# Patient Record
Sex: Male | Born: 1990 | Race: White | Hispanic: No | Marital: Single | Smoking: Never smoker
Health system: Southern US, Community
[De-identification: ages and names within clinical notes are randomized; demographics above are authoritative.]

## PROBLEM LIST (undated history)

## (undated) DIAGNOSIS — J302 Other seasonal allergic rhinitis: Secondary | ICD-10-CM

## (undated) DIAGNOSIS — K219 Gastro-esophageal reflux disease without esophagitis: Secondary | ICD-10-CM

---
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
## 2013-11-05 ENCOUNTER — Ambulatory Visit
Admission: RE | Admit: 2013-11-05 | Discharge: 2013-11-05 | Disposition: A | Discharge status: Home / Self Care | Payer: 59 | Source: Ambulatory Visit | Attending: Sports Medicine | Admitting: Sports Medicine

## 2013-11-05 ENCOUNTER — Encounter: Payer: Self-pay | Admitting: Sports Medicine

## 2013-11-05 ENCOUNTER — Encounter (INDEPENDENT_AMBULATORY_CARE_PROVIDER_SITE_OTHER): Payer: Self-pay

## 2013-11-05 ENCOUNTER — Ambulatory Visit (INDEPENDENT_AMBULATORY_CARE_PROVIDER_SITE_OTHER): Payer: 59 | Admitting: Sports Medicine

## 2013-11-05 VITALS — BP 115/75 | HR 68 | Ht 71.0 in | Wt 150.0 lb

## 2013-11-05 DIAGNOSIS — M25551 Pain in right hip: Secondary | ICD-10-CM

## 2013-11-05 DIAGNOSIS — M24159 Other articular cartilage disorders, unspecified hip: Secondary | ICD-10-CM

## 2013-11-05 DIAGNOSIS — M25559 Pain in unspecified hip: Secondary | ICD-10-CM

## 2013-11-05 DIAGNOSIS — M24151 Other articular cartilage disorders, right hip: Secondary | ICD-10-CM

## 2013-11-05 NOTE — Assessment & Plan Note (Signed)
On today's evaluation I found no significant abnormalities in the hip joint  A standard hip x-ray revealed good articular cartilage and a normal appearance

## 2013-11-05 NOTE — Assessment & Plan Note (Signed)
I think this is a rectus femoris strain based on both the ultrasound and examination  I suggested using a thigh sleeve Also begin a series of exercises to emphasize quadriceps and hip flexion strength  I reassured him that I did not think this involves this joint

## 2013-11-05 NOTE — Progress Notes (Signed)
   Subjective:    Patient ID: Ryan Paul, male    DOB: 1991/01/30, 23 y.o.   MRN: [id number]  HPI Mr. Paul has a history of chondromalacia of the right hip and presents to clinic with right hip pain. He had surgery on his right hip for femoral acetabular impingement .  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 This pain began at the start of summer. He describes an achy pain with occasional shooting pains over his right ASIS/ISIS region. He has been running more on pavement, playing more soccer on artificial turf, and playing a bit more basketball this summer. The pain is also reproducible when he brings his knees to the chest.   Son of Dr Wyszynski/ sister is Amil AmenJulia  Review of Systems     Objective:   Physical Exam Gen: well-dressed, well-nourished young man sitting comfortably  BP 115/75  Pulse 68  Ht 5\' 11"  (1.803 m)  Wt 150 lb (68.04 kg)  BMI 20.93 kg/m2   Right hip: 80-85 degree rotation. Full active and passive ROM bilaterally. Negative FABER and FADIR tests. TTP deep at and inferior to ISIS. Hip strength intact.  Left hip: 90 degree rotation. Full active and passive ROM bilaterally.    Ultrasound right hip: Hyperechoic area identified at rectus femoris insertion to ISIS. This hyperechoic area seems to be in the musculotendinous junction  No swelling around femoral head. There is a small defect that I think represents a postoperative change from the femoral acetabular impingement surgery     Assessment & Plan:  Right hip pain: - physical exam and ultrasound suggests that rectus femoris strain is the likely diagnosis. Joint involvement is unlikely, but will confirm with hip x-ray. - gave pt quad and hip flexor exercises - follow up PRN    Written By: Earlene PlaterBrian Antono, MS4 Reviewed and edited

## 2013-11-05 NOTE — Patient Instructions (Signed)
Work on quad and hip flexor strength: - straight leg raise - step ups - bike

## 2013-11-12 ENCOUNTER — Ambulatory Visit: Payer: 59 | Admitting: Sports Medicine

## 2013-12-15 ENCOUNTER — Encounter (HOSPITAL_COMMUNITY): Payer: Self-pay | Admitting: Pharmacy Technician

## 2013-12-16 ENCOUNTER — Encounter (HOSPITAL_COMMUNITY): Payer: Self-pay | Admitting: *Deleted

## 2013-12-16 MED ORDER — LACTATED RINGERS IV SOLN: INTRAVENOUS | Status: DC

## 2013-12-16 MED ORDER — CHLORHEXIDINE GLUCONATE 4 % EX LIQD
60.0000 mL | Freq: Once | CUTANEOUS | Status: DC
  Filled 2013-12-16: qty 60

## 2013-12-16 MED ORDER — CEFAZOLIN SODIUM-DEXTROSE 2-3 GM-% IV SOLR
2.0000 g | INTRAVENOUS | Status: AC
  Administered 2013-12-17: 2 g via INTRAVENOUS
  Filled 2013-12-16: qty 50

## 2013-12-17 ENCOUNTER — Ambulatory Visit (HOSPITAL_COMMUNITY)
Admission: RE | Admit: 2013-12-17 | Discharge: 2013-12-17 | Disposition: A | Discharge status: Home / Self Care | Payer: 59 | Source: Ambulatory Visit | Attending: Orthopedic Surgery | Admitting: Orthopedic Surgery

## 2013-12-17 ENCOUNTER — Ambulatory Visit (HOSPITAL_COMMUNITY): Payer: 59

## 2013-12-17 ENCOUNTER — Encounter (HOSPITAL_COMMUNITY): Payer: 59 | Admitting: Certified Registered Nurse Anesthetist

## 2013-12-17 ENCOUNTER — Ambulatory Visit (HOSPITAL_COMMUNITY): Payer: 59 | Admitting: Certified Registered Nurse Anesthetist

## 2013-12-17 ENCOUNTER — Encounter (HOSPITAL_COMMUNITY): Payer: Self-pay | Admitting: Certified Registered Nurse Anesthetist

## 2013-12-17 ENCOUNTER — Encounter (HOSPITAL_COMMUNITY)
Admission: RE | Disposition: A | Discharge status: Home / Self Care | Payer: Self-pay | Source: Ambulatory Visit | Attending: Orthopedic Surgery

## 2013-12-17 DIAGNOSIS — Y998 Other external cause status: Secondary | ICD-10-CM | POA: Diagnosis not present

## 2013-12-17 DIAGNOSIS — K219 Gastro-esophageal reflux disease without esophagitis: Secondary | ICD-10-CM | POA: Insufficient documentation

## 2013-12-17 DIAGNOSIS — Y92322 Soccer field as the place of occurrence of the external cause: Secondary | ICD-10-CM | POA: Insufficient documentation

## 2013-12-17 DIAGNOSIS — Y9366 Activity, soccer: Secondary | ICD-10-CM | POA: Diagnosis not present

## 2013-12-17 DIAGNOSIS — J45909 Unspecified asthma, uncomplicated: Secondary | ICD-10-CM | POA: Insufficient documentation

## 2013-12-17 DIAGNOSIS — W19XXXA Unspecified fall, initial encounter: Secondary | ICD-10-CM | POA: Insufficient documentation

## 2013-12-17 DIAGNOSIS — S42022A Displaced fracture of shaft of left clavicle, initial encounter for closed fracture: Secondary | ICD-10-CM | POA: Insufficient documentation

## 2013-12-17 HISTORY — DX: Gastro-esophageal reflux disease without esophagitis: K21.9

## 2013-12-17 HISTORY — DX: Other seasonal allergic rhinitis: J30.2

## 2013-12-17 HISTORY — PX: ORIF CLAVICULAR FRACTURE: SHX5055

## 2013-12-17 LAB — COMPREHENSIVE METABOLIC PANEL
ALBUMIN: 4.2 g/dL (ref 3.5–5.2)
ALT: 16 U/L (ref 0–53)
ANION GAP: 14 (ref 5–15)
AST: 19 U/L (ref 0–37)
Alkaline Phosphatase: 83 U/L (ref 39–117)
BUN: 17 mg/dL (ref 6–23)
CALCIUM: 9.3 mg/dL (ref 8.4–10.5)
CO2: 24 mEq/L (ref 19–32)
CREATININE: 0.97 mg/dL (ref 0.50–1.35)
Chloride: 102 mEq/L (ref 96–112)
GFR calc Af Amer: >90 mL/min (ref >90)
GFR calc non Af Amer: >90 mL/min (ref >90)
Glucose, Bld: 89 mg/dL (ref 70–99)
POTASSIUM: 4.1 meq/L (ref 3.7–5.3)
Sodium: 140 mEq/L (ref 137–147)
TOTAL PROTEIN: 7.7 g/dL (ref 6.0–8.3)
Total Bilirubin: 0.5 mg/dL (ref 0.3–1.2)

## 2013-12-17 LAB — CBC WITH DIFFERENTIAL/PLATELET
BASOS PCT: 1 % (ref 0–1)
Basophils Absolute: 0 10*3/uL (ref 0.0–0.1)
EOS ABS: 0.2 10*3/uL (ref 0.0–0.7)
EOS PCT: 4 % (ref 0–5)
HCT: 47.9 % (ref 39.0–52.0)
HEMOGLOBIN: 16.6 g/dL (ref 13.0–17.0)
Lymphocytes Relative: 44 % (ref 12–46)
Lymphs Abs: 1.7 10*3/uL (ref 0.7–4.0)
MCH: 30 pg (ref 26.0–34.0)
MCHC: 34.7 g/dL (ref 30.0–36.0)
MCV: 86.6 fL (ref 78.0–100.0)
MONOS PCT: 9 % (ref 3–12)
Monocytes Absolute: 0.3 10*3/uL (ref 0.1–1.0)
NEUTROS PCT: 42 % — AB (ref 43–77)
Neutro Abs: 1.6 10*3/uL — ABNORMAL LOW (ref 1.7–7.7)
Platelets: 186 10*3/uL (ref 150–400)
RBC: 5.53 MIL/uL (ref 4.22–5.81)
RDW: 12.4 % (ref 11.5–15.5)
WBC: 3.9 10*3/uL — ABNORMAL LOW (ref 4.0–10.5)

## 2013-12-17 LAB — APTT: APTT: 29 s (ref 24–37)

## 2013-12-17 LAB — PROTIME-INR
INR: 1.04 (ref 0.00–1.49)
PROTHROMBIN TIME: 13.6 s (ref 11.6–15.2)

## 2013-12-17 SURGERY — OPEN REDUCTION INTERNAL FIXATION (ORIF) CLAVICULAR FRACTURE
Anesthesia: General | Laterality: Left

## 2013-12-17 MED ORDER — STERILE WATER FOR INJECTION IJ SOLN
INTRAMUSCULAR | Status: AC
  Filled 2013-12-17: qty 10

## 2013-12-17 MED ORDER — DEXAMETHASONE SODIUM PHOSPHATE 4 MG/ML IJ SOLN
INTRAMUSCULAR | Status: DC | PRN
  Administered 2013-12-17: 8 mg via INTRAVENOUS

## 2013-12-17 MED ORDER — DEXAMETHASONE SODIUM PHOSPHATE 4 MG/ML IJ SOLN
INTRAMUSCULAR | Status: AC
  Filled 2013-12-17: qty 2

## 2013-12-17 MED ORDER — OXYCODONE HCL 5 MG/5ML PO SOLN: 5.0000 mg | Freq: Once | ORAL | Status: AC | PRN

## 2013-12-17 MED ORDER — BUPIVACAINE LIPOSOME 1.3 % IJ SUSP
20.0000 mL | INTRAMUSCULAR | Status: DC
  Filled 2013-12-17: qty 20

## 2013-12-17 MED ORDER — PROPOFOL 10 MG/ML IV BOLUS
INTRAVENOUS | Status: AC
  Filled 2013-12-17: qty 20

## 2013-12-17 MED ORDER — BUPIVACAINE LIPOSOME 1.3 % IJ SUSP
INTRAMUSCULAR | Status: DC | PRN
  Administered 2013-12-17: 20 mL

## 2013-12-17 MED ORDER — LIDOCAINE HCL (CARDIAC) 20 MG/ML IV SOLN
INTRAVENOUS | Status: AC
  Filled 2013-12-17: qty 5

## 2013-12-17 MED ORDER — ONDANSETRON HCL 4 MG/2ML IJ SOLN
INTRAMUSCULAR | Status: AC
  Filled 2013-12-17: qty 2

## 2013-12-17 MED ORDER — OXYCODONE-ACETAMINOPHEN 5-325 MG PO TABS
1.0000 | ORAL_TABLET | ORAL | Status: AC | PRN
Start: 2013-12-17 — End: ?

## 2013-12-17 MED ORDER — DIAZEPAM 5 MG PO TABS: 2.5000 mg | ORAL_TABLET | Freq: Four times a day (QID) | ORAL | Status: AC | PRN

## 2013-12-17 MED ORDER — OXYCODONE HCL 5 MG PO TABS
ORAL_TABLET | ORAL | Status: AC
  Filled 2013-12-17: qty 1

## 2013-12-17 MED ORDER — HYDROMORPHONE HCL 1 MG/ML IJ SOLN
INTRAMUSCULAR | Status: AC
  Filled 2013-12-17: qty 1

## 2013-12-17 MED ORDER — SODIUM CHLORIDE 0.9 % IJ SOLN
INTRAMUSCULAR | Status: DC | PRN
  Administered 2013-12-17: 10 mL

## 2013-12-17 MED ORDER — PROPOFOL 10 MG/ML IV BOLUS
INTRAVENOUS | Status: AC
Start: 2013-12-17 — End: 2013-12-17
  Filled 2013-12-17: qty 20

## 2013-12-17 MED ORDER — LACTATED RINGERS IV SOLN
INTRAVENOUS | Status: DC
  Administered 2013-12-17: 11:00:00 via INTRAVENOUS

## 2013-12-17 MED ORDER — GLYCOPYRROLATE 0.2 MG/ML IJ SOLN
INTRAMUSCULAR | Status: DC | PRN
  Administered 2013-12-17: 0.6 mg via INTRAVENOUS

## 2013-12-17 MED ORDER — LACTATED RINGERS IV SOLN
INTRAVENOUS | Status: DC | PRN
  Administered 2013-12-17 (×2): via INTRAVENOUS

## 2013-12-17 MED ORDER — ONDANSETRON HCL 4 MG/2ML IJ SOLN
4.0000 mg | Freq: Once | INTRAMUSCULAR | Status: AC | PRN
  Administered 2013-12-17: 4 mg via INTRAVENOUS

## 2013-12-17 MED ORDER — LIDOCAINE HCL (CARDIAC) 20 MG/ML IV SOLN
INTRAVENOUS | Status: DC | PRN
  Administered 2013-12-17: 50 mg via INTRAVENOUS

## 2013-12-17 MED ORDER — FENTANYL CITRATE 0.05 MG/ML IJ SOLN
INTRAMUSCULAR | Status: DC | PRN
  Administered 2013-12-17: 50 ug via INTRAVENOUS
  Administered 2013-12-17 (×2): 100 ug via INTRAVENOUS

## 2013-12-17 MED ORDER — ONDANSETRON HCL 8 MG PO TABS: 8.0000 mg | ORAL_TABLET | Freq: Once | ORAL | Status: DC

## 2013-12-17 MED ORDER — PROPOFOL 10 MG/ML IV BOLUS
INTRAVENOUS | Status: DC | PRN
  Administered 2013-12-17: 150 mg via INTRAVENOUS
  Administered 2013-12-17: 50 mg via INTRAVENOUS
  Administered 2013-12-17: 10 mg via INTRAVENOUS

## 2013-12-17 MED ORDER — DEXTROSE 5 % IV SOLN
10.0000 mg | INTRAVENOUS | Status: DC | PRN
  Administered 2013-12-17: 25 ug/min via INTRAVENOUS

## 2013-12-17 MED ORDER — VECURONIUM BROMIDE 10 MG IV SOLR
INTRAVENOUS | Status: DC | PRN
  Administered 2013-12-17: 1 mg via INTRAVENOUS
  Administered 2013-12-17: 2 mg via INTRAVENOUS

## 2013-12-17 MED ORDER — FENTANYL CITRATE 0.05 MG/ML IJ SOLN
INTRAMUSCULAR | Status: AC
  Filled 2013-12-17: qty 5

## 2013-12-17 MED ORDER — ROCURONIUM BROMIDE 50 MG/5ML IV SOLN
INTRAVENOUS | Status: AC
  Filled 2013-12-17: qty 1

## 2013-12-17 MED ORDER — VECURONIUM BROMIDE 10 MG IV SOLR
INTRAVENOUS | Status: AC
  Filled 2013-12-17: qty 10

## 2013-12-17 MED ORDER — 0.9 % SODIUM CHLORIDE (POUR BTL) OPTIME
TOPICAL | Status: DC | PRN
  Administered 2013-12-17: 1000 mL

## 2013-12-17 MED ORDER — ROCURONIUM BROMIDE 100 MG/10ML IV SOLN
INTRAVENOUS | Status: DC | PRN
  Administered 2013-12-17: 50 mg via INTRAVENOUS

## 2013-12-17 MED ORDER — MEPERIDINE HCL 25 MG/ML IJ SOLN: 6.2500 mg | INTRAMUSCULAR | Status: DC | PRN

## 2013-12-17 MED ORDER — OXYCODONE HCL 5 MG PO TABS
5.0000 mg | ORAL_TABLET | Freq: Once | ORAL | Status: AC | PRN
  Administered 2013-12-17: 5 mg via ORAL

## 2013-12-17 MED ORDER — NEOSTIGMINE METHYLSULFATE 10 MG/10ML IV SOLN
INTRAVENOUS | Status: DC | PRN
  Administered 2013-12-17: 4 mg via INTRAVENOUS

## 2013-12-17 MED ORDER — ONDANSETRON HCL 4 MG/2ML IJ SOLN
INTRAMUSCULAR | Status: DC | PRN
  Administered 2013-12-17: 4 mg via INTRAVENOUS

## 2013-12-17 MED ORDER — ONDANSETRON 8 MG PO TBDP
8.0000 mg | ORAL_TABLET | Freq: Once | ORAL | Status: AC
Start: 2013-12-17 — End: 2013-12-17
  Administered 2013-12-17: 8 mg via ORAL
  Filled 2013-12-17: qty 1

## 2013-12-17 MED ORDER — EPHEDRINE SULFATE 50 MG/ML IJ SOLN
INTRAMUSCULAR | Status: DC | PRN
  Administered 2013-12-17: 10 mg via INTRAVENOUS

## 2013-12-17 MED ORDER — HYDROMORPHONE HCL 1 MG/ML IJ SOLN
0.2500 mg | INTRAMUSCULAR | Status: DC | PRN
  Administered 2013-12-17 (×4): 0.5 mg via INTRAVENOUS

## 2013-12-17 MED ORDER — MIDAZOLAM HCL 5 MG/5ML IJ SOLN
INTRAMUSCULAR | Status: DC | PRN
  Administered 2013-12-17: 2 mg via INTRAVENOUS

## 2013-12-17 MED ORDER — GLYCOPYRROLATE 0.2 MG/ML IJ SOLN
INTRAMUSCULAR | Status: AC
  Filled 2013-12-17: qty 2

## 2013-12-17 SURGICAL SUPPLY — 63 items
ADH SKN CLS APL DERMABOND .7 (GAUZE/BANDAGES/DRESSINGS) ×1
BIT DRILL 2.0 LNG QUCK RELEASE (BIT) IMPLANT
BIT DRILL 2.3 QUICK RELEASE (BIT) IMPLANT
BIT DRILL 2.8X5 QR DISP (BIT) ×2 IMPLANT
CLOSURE WOUND 1/2 X4 (GAUZE/BANDAGES/DRESSINGS) ×1
COVER SURGICAL LIGHT HANDLE (MISCELLANEOUS) ×3 IMPLANT
DERMABOND ADVANCED (GAUZE/BANDAGES/DRESSINGS) ×2
DERMABOND ADVANCED .7 DNX12 (GAUZE/BANDAGES/DRESSINGS) ×1 IMPLANT
DRAPE C-ARM 42X72 X-RAY (DRAPES) ×3 IMPLANT
DRAPE INCISE IOBAN 66X45 STRL (DRAPES) ×3 IMPLANT
DRAPE ORTHO SPLIT 77X108 STRL (DRAPES) ×6
DRAPE SURG 17X23 STRL (DRAPES) ×3 IMPLANT
DRAPE SURG ORHT 6 SPLT 77X108 (DRAPES) ×1 IMPLANT
DRAPE U-SHAPE 47X51 STRL (DRAPES) ×6 IMPLANT
DRILL 2.0 LNG QUICK RELEASE (BIT) ×3
DRILL 2.3 QUICK RELEASE (BIT) ×3
DRSG EMULSION OIL 3X3 NADH (GAUZE/BANDAGES/DRESSINGS) ×3 IMPLANT
DRSG MEPILEX BORDER 4X8 (GAUZE/BANDAGES/DRESSINGS) ×3 IMPLANT
ELECT REM PT RETURN 9FT ADLT (ELECTROSURGICAL) ×3
ELECTRODE REM PT RTRN 9FT ADLT (ELECTROSURGICAL) ×1 IMPLANT
GAUZE SPONGE 4X4 12PLY STRL (GAUZE/BANDAGES/DRESSINGS) ×3 IMPLANT
GLOVE BIO SURGEON STRL SZ7.5 (GLOVE) ×3 IMPLANT
GLOVE BIO SURGEON STRL SZ8 (GLOVE) ×3 IMPLANT
GLOVE EUDERMIC 7 POWDERFREE (GLOVE) ×5 IMPLANT
GLOVE SS BIOGEL STRL SZ 7.5 (GLOVE) ×1 IMPLANT
GLOVE SUPERSENSE BIOGEL SZ 7.5 (GLOVE) ×2
GOWN STRL REUS W/ TWL LRG LVL3 (GOWN DISPOSABLE) ×1 IMPLANT
GOWN STRL REUS W/ TWL XL LVL3 (GOWN DISPOSABLE) ×2 IMPLANT
GOWN STRL REUS W/TWL LRG LVL3 (GOWN DISPOSABLE) ×3
GOWN STRL REUS W/TWL XL LVL3 (GOWN DISPOSABLE) ×6
KIT BASIN OR (CUSTOM PROCEDURE TRAY) ×3 IMPLANT
KIT ROOM TURNOVER OR (KITS) ×3 IMPLANT
MANIFOLD NEPTUNE II (INSTRUMENTS) ×3 IMPLANT
NDL HYPO 25GX1X1/2 BEV (NEEDLE) ×1 IMPLANT
NEEDLE HYPO 25GX1X1/2 BEV (NEEDLE) ×3 IMPLANT
NS IRRIG 1000ML POUR BTL (IV SOLUTION) ×3 IMPLANT
PACK SHOULDER (CUSTOM PROCEDURE TRAY) ×3 IMPLANT
PAD ARMBOARD 7.5X6 YLW CONV (MISCELLANEOUS) ×6 IMPLANT
PLATE LEFT CLAVICLE 8H (Plate) ×2 IMPLANT
SCREW HEXALOBE LOCKING 3.5X14M (Screw) ×2 IMPLANT
SCREW HEXALOBE NON-LOCK 3.5X14 (Screw) ×2 IMPLANT
SCREW LOCK 12X3.5X HEXALOBE (Screw) IMPLANT
SCREW LOCKING 3.5X12 (Screw) ×3 IMPLANT
SCREW LOCKING 3.5X8 (Screw) ×2 IMPLANT
SCREW NON TOGG 2.3X20MM (Screw) ×2 IMPLANT
SCREW NONLOCK HEX 3.5X12 (Screw) ×4 IMPLANT
SLING ARM FOAM STRAP LRG (SOFTGOODS) ×3 IMPLANT
SPONGE LAP 18X18 X RAY DECT (DISPOSABLE) ×6 IMPLANT
SPONGE LAP 4X18 X RAY DECT (DISPOSABLE) ×6 IMPLANT
STAPLER VISISTAT 35W (STAPLE) ×3 IMPLANT
STRIP CLOSURE SKIN 1/2X4 (GAUZE/BANDAGES/DRESSINGS) ×2 IMPLANT
SUCTION FRAZIER TIP 10 FR DISP (SUCTIONS) ×3 IMPLANT
SUT MNCRL AB 3-0 PS2 18 (SUTURE) ×3 IMPLANT
SUT VIC AB 1 CT1 27 (SUTURE) ×3
SUT VIC AB 1 CT1 27XBRD ANBCTR (SUTURE) ×1 IMPLANT
SUT VIC AB 2-0 CT1 27 (SUTURE) ×3
SUT VIC AB 2-0 CT1 TAPERPNT 27 (SUTURE) ×1 IMPLANT
SUT VICRYL 0 CT 1 36IN (SUTURE) ×3 IMPLANT
SYR CONTROL 10ML LL (SYRINGE) ×3 IMPLANT
TOWEL OR 17X24 6PK STRL BLUE (TOWEL DISPOSABLE) ×3 IMPLANT
TOWEL OR 17X26 10 PK STRL BLUE (TOWEL DISPOSABLE) ×3 IMPLANT
WATER STERILE IRR 1000ML POUR (IV SOLUTION) ×3 IMPLANT
YANKAUER SUCT BULB TIP NO VENT (SUCTIONS) ×3 IMPLANT

## 2013-12-17 NOTE — Discharge Instructions (Signed)
You may remove current dressing and shower on day 3. There is dermabond over your incision itself which will remain in place and is waterproof. You may replace a clean dressing after shower.  Use ice to operative site for pain control, atleast 15-20 minutes 3-4 times a day and more as needed.  You may allow arm to dangle by side and to move your elbow wrist and hand. Continue sling use otherwise.  What to eat:  For your first meals, you should eat lightly; only small meals initially.  If you do not have nausea, you may eat larger meals.  Avoid spicy, greasy and heavy food.    General Anesthesia, Adult, Care After  Refer to this sheet in the next few weeks. These instructions provide you with information on caring for yourself after your procedure. Your health care provider may also give you more specific instructions. Your treatment has been planned according to current medical practices, but problems sometimes occur. Call your health care provider if you have any problems or questions after your procedure.  WHAT TO EXPECT AFTER THE PROCEDURE  After the procedure, it is typical to experience:  Sleepiness.  Nausea and vomiting. HOME CARE INSTRUCTIONS  For the first 24 hours after general anesthesia:  Have a responsible person with you.  Do not drive a car. If you are alone, do not take public transportation.  Do not drink alcohol.  Do not take medicine that has not been prescribed by your health care provider.  Do not sign important papers or make important decisions.  You may resume a normal diet and activities as directed by your health care provider.  Change bandages (dressings) as directed.  If you have questions or problems that seem related to general anesthesia, call the hospital and ask for the anesthetist or anesthesiologist on call. SEEK MEDICAL CARE IF:  You have nausea and vomiting that continue the day after anesthesia.  You develop a rash. SEEK IMMEDIATE MEDICAL CARE IF:   You have difficulty breathing.  You have chest pain.  You have any allergic problems. Document Released: 06/11/2000 Document Revised: 11/05/2012 Document Reviewed: 09/18/2012  [facility] Patient Information 2014 [place] [place].

## 2013-12-17 NOTE — Anesthesia Postprocedure Evaluation (Signed)
  Anesthesia Post-op Note  Patient: Ryan Paul  Procedure(s) Performed: Procedure(s): OPEN REDUCTION INTERNAL FIXATION (ORIF)  LEFT CLAVICULAR FRACTURE (Left)  Patient Location: PACU  Anesthesia Type: General   Level of Consciousness: awake, alert  and oriented  Airway and Oxygen Therapy: Patient Spontanous Breathing  Post-op Pain: mild  Post-op Assessment: Post-op Vital signs reviewed  Post-op Vital Signs: Reviewed  Last Vitals:  Filed Vitals:   12/17/13 1600  BP: 142/82  Pulse: 70  Temp:   Resp: 15    Complications: No apparent anesthesia complications

## 2013-12-17 NOTE — Anesthesia Preprocedure Evaluation (Signed)
Anesthesia Evaluation  Patient identified by MRN, date of birth, ID band Patient awake    Reviewed: Allergy & Precautions, H&P , NPO status , Patient's Chart, lab work & pertinent test results  Airway Mallampati: I TM Distance: >3 FB Neck ROM: Full    Dental   Pulmonary asthma ,          Cardiovascular     Neuro/Psych    GI/Hepatic   Endo/Other    Renal/GU      Musculoskeletal   Abdominal   Peds  Hematology   Anesthesia Other Findings   Reproductive/Obstetrics                           Anesthesia Physical Anesthesia Plan  ASA: I  Anesthesia Plan: General   Post-op Pain Management:    Induction: Intravenous  Airway Management Planned: Oral ETT  Additional Equipment:   Intra-op Plan:   Post-operative Plan: Extubation in OR  Informed Consent: I have reviewed the patients History and Physical, chart, labs and discussed the procedure including the risks, benefits and alternatives for the proposed anesthesia with the patient or authorized representative who has indicated his/her understanding and acceptance.     Plan Discussed with: CRNA and Surgeon  Anesthesia Plan Comments:         Anesthesia Quick Evaluation  

## 2013-12-17 NOTE — Transfer of Care (Signed)
Immediate Anesthesia Transfer of Care Note  Patient: Ryan Paul  Procedure(s) Performed: Procedure(s): OPEN REDUCTION INTERNAL FIXATION (ORIF)  LEFT CLAVICULAR FRACTURE (Left)  Patient Location: PACU  Anesthesia Type:General  Level of Consciousness: patient cooperative and responds to stimulation  Airway & Oxygen Therapy: Patient Spontanous Breathing and Patient connected to nasal cannula oxygen  Post-op Assessment: Report given to PACU RN and Post -op Vital signs reviewed and stable  Post vital signs: Reviewed and stable  Complications: No apparent anesthesia complications

## 2013-12-17 NOTE — H&P (Signed)
Ryan Paul    Chief Complaint: LEFT CLAVICLE FRACTURE HPI: The patient is a 23 y.o. male with a severely displaced left clavicle fracture  Past Medical History  Diagnosis Date  . Asthma   . GERD (gastroesophageal reflux disease)   . Seasonal allergies     Past Surgical History  Procedure Laterality Date  . Hip arthroscopy w/ labral repair  2010    Family History  Problem Relation Age of Onset  . Heart disease Maternal Grandfather   . Hypertension Maternal Grandfather   . Hyperlipidemia Paternal Grandfather   . Heart disease Paternal Grandfather   . Hypertension Paternal Grandfather     Social History:  reports that he has never smoked. He has never used smokeless tobacco. His alcohol and drug histories are not on file.  Allergies: No Known Allergies  Medications Prior to Admission  Medication Dose Route Frequency Provider Last Rate Last Dose  . meningococcal polysaccharide (MENACTRA) injection 0.5 mL  0.5 mL Intramuscular Once Kristian CoveyBruce W Burchette, MD       Medications Prior to Admission  Medication Sig Dispense Refill  . naproxen sodium (ALEVE) 220 MG tablet Take 220-440 mg by mouth 2 (two) times daily as needed (for pan).      Marland Kitchen. oxyCODONE-acetaminophen (PERCOCET) 10-325 MG per tablet Take 1 tablet by mouth every 4 (four) hours as needed for pain (taking at bedtime only).         Physical Exam: left shoulder with prominent swelling over mid clavicle with underlying bony deformity. N/V intact distally  Vitals  Temp:  [98.6 F (37 C)] 98.6 F (37 C) (10/01 1034) Pulse Rate:  [58-70] 70 (10/01 1128) Resp:  [13-22] 22 (10/01 1128) BP: (119-132)/(71-72) 119/72 mmHg (10/01 1125) SpO2:  [98 %-100 %] 99 % (10/01 1128) Weight:  [69.996 kg (154 lb 5 oz)] 69.996 kg (154 lb 5 oz) (10/01 1034)  Assessment/Plan  Impression: LEFT CLAVICLE FRACTURE  Plan of Action: Procedure(s): OPEN REDUCTION INTERNAL FIXATION (ORIF)  LEFT CLAVICULAR FRACTURE  Ryan Conaty  Paul 12/17/2013, 11:36 AM

## 2013-12-17 NOTE — Op Note (Signed)
12/17/2013  2:19 PM  PATIENT:   Ryan Paul  23 y.o. male  PRE-OPERATIVE DIAGNOSIS:  DISPLACED LEFT CLAVICLE FRACTURE  POST-OPERATIVE DIAGNOSIS:  Same  PROCEDURE:  ORIF  SURGEON:  Bruk Tumolo, Vania ReaKevin M. M.D.  ASSISTANTS: Shuford pac   ANESTHESIA:   GET + local  EBL: 50   SPECIMEN:  none  Drains: none   PATIENT DISPOSITION:  PACU - hemodynamically stable.    PLAN OF CARE: Discharge to home after PACU  Dictation# [id number]

## 2013-12-17 NOTE — Op Note (Signed)
NAME:  Ryan Paul, Ryan Paul              ACCOUNT NO.:  [id number]  MEDICAL RECORD NO.:  [id number]  LOCATION:  [facility]                         FACILITY:  [facility]  PHYSICIAN:  Vania ReaKevin M. Shauntelle Jamerson, M.D.  DATE OF BIRTH:  1990-06-17  DATE OF PROCEDURE:  12/17/2013 DATE OF DISCHARGE:                              OPERATIVE REPORT   PREOPERATIVE DIAGNOSIS:  Displaced comminuted left midshaft clavicle fracture.  POSTOPERATIVE DIAGNOSIS:  Displaced comminuted left midshaft clavicle fracture.  PROCEDURE:  Open reduction and internal fixation of the displaced left clavicle fracture.  SURGEON:  Vania ReaKevin M. Annaston Upham, M.D.  Threasa HeadsASSISTANFrench Ana:  Tracy A. Shuford, PA-C.  ANESTHESIA:  General endotracheal as well as local.  ESTIMATED BLOOD LOSS:  Less than 50 mL.  DRAINS:  None.  HISTORY:  Ryan Paul is a 23 year old male, who injured his left shoulder playing soccer approximately 2 weeks ago landing on the apex left shoulder sustaining a comminuted and displaced midshaft clavicle fracture.  Radiographs confirmed significant shortening with a butterfly fragment that is spun close to 90 degrees and tenting the skin.  Due to significant displacement, comminution, and impending skin compromise, he is brought to the operating room at this time for planned open reduction and internal fixation.  Preoperatively, I counseled Ryan Paul and his family regarding treatment options and risks versus benefits thereof.  Possible surgical complications were all reviewed including the potential for bleeding, infection, neurovascular injury, malunion, nonunion, loss of fixation, and possible need for additional surgery.  He understands and accepts and agrees our planned procedure.  PROCEDURE IN DETAIL:  After undergoing routine preop evaluation, the patient received prophylactic antibiotics.  Brought to the operating room, placed supine on the operating table, underwent smooth induction of a general endotracheal anesthesia.  Placed into  beach-chair position and appropriately padded and protected.  The left shoulder girdle region was then sterilely prepped and draped in standard fashion.  Time-out was called.  A transverse incision along the inferior border of the clavicle was made approximately 6 cm in length centered at the fracture site. Dissection carried deeply and the skin flaps were elevated superiorly and inferiorly.  We then divided the clavicle pectoral fascia and exposed the clavicle both medial and lateral at the fracture site. There was some moderate comminution with a single dominant butterfly fragment that had spun 90 degrees.  Some callus had been starting to develop in the area and we dissected the soft tissue envelope to isolate the bony fragments.  We are able to mobilize the fracture in with manipulation table to gain realignment at the fracture site and reposition the butterfly fragment and hold provisional fixation with bone clamps.  At this point, intraoperative fluoroscopic images were then used to confirm good overall alignment at the fracture site.  At this point, we selected the appropriate plate from the Acumed clavicle plating system.  I did some additional contouring for appropriate contact to the bone.  At this point, I re-went ahead and transfixed the plate initially with 2 nonlocking screws.  Fasting plate to the clavicle with excellent fit and compression and confirmed anatomic alignment at the fracture site.  At this point, then I used a lag screw technique to lag the butterfly  fragment into the lateral segment of the clavicle obtaining good alignment.  We then had completed the fixation of the plate to the clavicle with combination of the locking screws with excellent fit and fixation.  Final images were then obtained which showed good alignment of the fracture site and good position of the hardware.  At this point, the wound was copiously irrigated.  Hemostasis was obtained.  The  periosteal and deep fascial layers were closed with a series of figure-of-eight #1 Vicryl sutures creating a robust soft tissue envelope about the repair of construct and clavicle.  A 2-0 Vicryl used for the subcu layer and intracuticular 3-0 Monocryl of the skin followed by Dermabond and a Mepilex dressing abundant Exparel sustained release.  Local anesthetic was instilled into the peri- incisional soft tissues  prior to placement of the dressings.  Left arm was then placed in a sling.  The patient was awakened, extubated, and taken to recovery room in stable condition.     Vania Rea. Vasiliki Smaldone, M.D.     KMS/MEDQ  D:  12/17/2013  T:  12/17/2013  Job:  [id number]

## 2013-12-21 ENCOUNTER — Encounter (HOSPITAL_COMMUNITY): Payer: Self-pay | Admitting: Orthopedic Surgery

## 2014-07-29 ENCOUNTER — Ambulatory Visit (INDEPENDENT_AMBULATORY_CARE_PROVIDER_SITE_OTHER): Payer: 59 | Admitting: Sports Medicine

## 2014-07-29 ENCOUNTER — Encounter: Payer: Self-pay | Admitting: Sports Medicine

## 2014-07-29 VITALS — BP 111/76 | HR 74 | Ht 71.0 in | Wt 150.0 lb

## 2014-07-29 DIAGNOSIS — M25551 Pain in right hip: Secondary | ICD-10-CM | POA: Diagnosis not present

## 2014-07-29 NOTE — Assessment & Plan Note (Signed)
Hx of labral tear but this seems stable and ASX now  Still mild pain at sartorius insertion but only with sitting and too much resistance  Reassured and would keep up normal activities  Concerned about increased long term risk - advised less likely with normal weight and exc strength

## 2014-07-29 NOTE — Progress Notes (Signed)
Patient ID: Ryan Paul, male   DOB: 01/02/1991, 24 y.o.   MRN: [id number]  Patient has a history of labral repair of RT hip.  Long term soccer athlete. [facility] [employer]by Renella CunasAlston Stubbs, MD.  Some remodeling of fermoral neck.  RT hip has been good x when I saw him for a partial sartorius tendon tear at [facility]IS last year.  Now active and plays sports with no real hip pain.  Gets mild pain above hip near ASIS with sitting too much.  Also hx of sdisplaced clavicle Fx treated with plate and IF by Dr supple last year.  This has done well.  Comes for advice and reevaluation.  Exam Muscular/ NAD BP 111/76 mmHg  Pulse 74  Ht 5\' 11"  (1.803 m)  Wt 150 lb (68.04 kg)  BMI 20.93 kg/m2  RT hip ; full ROM/ he gets 120 deg rotation at 90/90 Hip acduction/ adduction/ flexion strength all excellent Neg FADIR with mild pain on grind at end point Neg FABER with nl ROM Sartorius strength exelletn but feels this at attachment ASIS  Clavical healed well NT to percussion exc strength

## 2015-04-20 DIAGNOSIS — S42022D Displaced fracture of shaft of left clavicle, subsequent encounter for fracture with routine healing: Secondary | ICD-10-CM | POA: Diagnosis not present

## 2015-05-09 MED FILL — SERTRALINE HCL 100 MG TAB: 100 | 60 days supply | Qty: 60 | Fill #2

## 2015-05-19 ENCOUNTER — Other Ambulatory Visit: Payer: Self-pay

## 2015-05-19 MED ORDER — ALBUTEROL SULFATE HFA 108 (90 BASE) MCG/ACT IN AERS: 2.0000 | INHALATION_SPRAY | Freq: Four times a day (QID) | RESPIRATORY_TRACT | Status: AC | PRN

## 2015-05-19 MED FILL — PROAIR HFA 90 MCG INHALER: 108 (90 BAS | 25 days supply | Qty: 9 | Fill #0

## 2015-06-07 DIAGNOSIS — F411 Generalized anxiety disorder: Secondary | ICD-10-CM | POA: Diagnosis not present

## 2015-06-24 DIAGNOSIS — F419 Anxiety disorder, unspecified: Secondary | ICD-10-CM | POA: Diagnosis not present

## 2015-06-24 MED FILL — SERTRALINE HCL 100 MG TAB: 100 | 30 days supply | Qty: 60 | Fill #0 | Status: TO

## 2015-08-02 DIAGNOSIS — F419 Anxiety disorder, unspecified: Secondary | ICD-10-CM | POA: Diagnosis not present

## 2015-08-02 MED FILL — SERTRALINE HCL 100 MG TAB: 100 | 30 days supply | Qty: 60 | Fill #0

## 2015-09-01 MED FILL — SERTRALINE HCL 100 MG TAB: 100 | 30 days supply | Qty: 60 | Fill #1 | Status: TO

## 2015-10-04 MED FILL — SERTRALINE HCL 100 MG TAB: 100 | 30 days supply | Qty: 60 | Fill #0

## 2015-10-16 IMAGING — RF DG CLAVICLE*L*
1 series · 2 of 2 positions shown · IV contrast (agent unspecified)
Comparison: None

CLINICAL DATA: ORIF left clavicle

EXAM:
DG C-ARM 61-120 MIN; LEFT CLAVICLE - 2+ VIEWS
TECHNIQUE: Two views from portable C-arm radiography obtained in the operating
room were submitted
CONTRAST:  No contrast
FLUOROSCOPY TIME:  9 seconds

[Series 1: run · 2 of 2 slices shown]
[im 1/2]
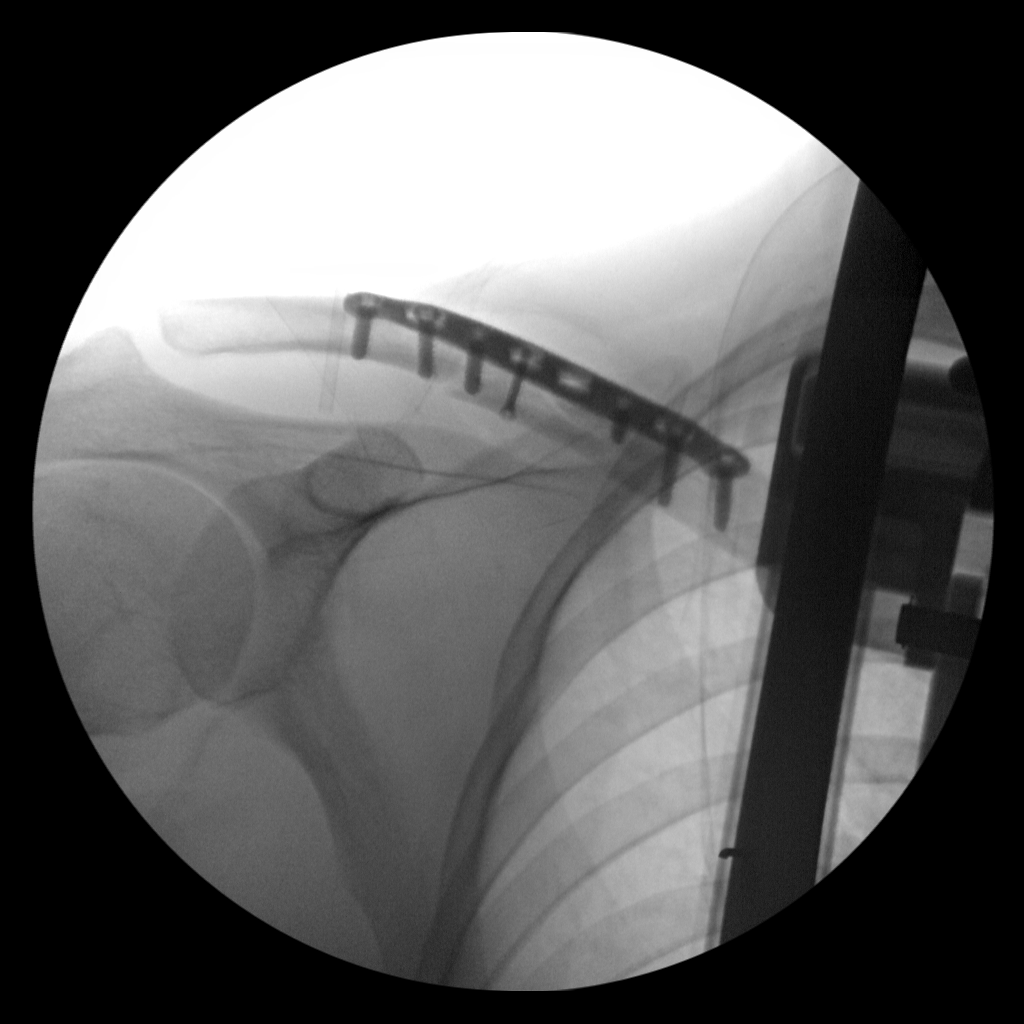
[im 2/2]
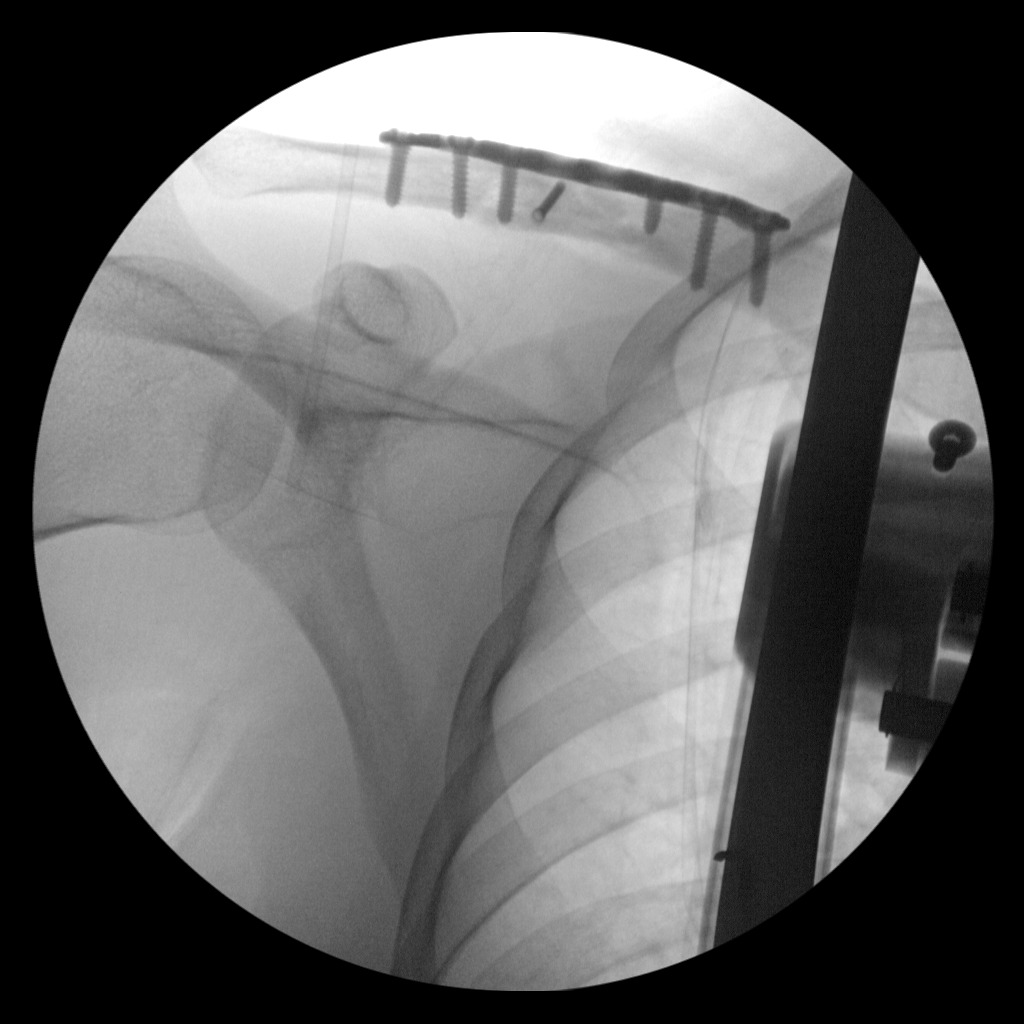

[2 of 2 positions shown; findings below may reference images not displayed]

FINDINGS: Mid left clavicle fracture is fixated by a sideplate and screw
device. Hardware components and fracture fragments are in anatomic
alignment.
IMPRESSION: 1. Status post ORIF of left cryo vocal fracture.

## 2015-11-03 MED FILL — SERTRALINE HCL 100 MG TAB: 100 | 30 days supply | Qty: 60 | Fill #0

## 2015-11-15 DIAGNOSIS — F419 Anxiety disorder, unspecified: Secondary | ICD-10-CM | POA: Diagnosis not present

## 2015-11-23 MED FILL — CIALIS 10 MG TABLET: 10 | 30 days supply | Qty: 6 | Fill #0

## 2015-12-07 MED FILL — SERTRALINE HCL 100 MG TAB: 100 | 30 days supply | Qty: 60 | Fill #0

## 2015-12-28 MED FILL — CIALIS 10 MG TABLET: 10 | 30 days supply | Qty: 6 | Fill #0

## 2016-01-09 MED FILL — SERTRALINE HCL 100 MG TAB: 100 | 30 days supply | Qty: 60 | Fill #1

## 2016-02-10 MED FILL — SERTRALINE HCL 100 MG TAB: 100 | 30 days supply | Qty: 60 | Fill #2

## 2016-02-27 MED FILL — CIALIS 10 MG TABLET: 10 | 30 days supply | Qty: 6 | Fill #0

## 2016-03-13 MED FILL — SERTRALINE HCL 100 MG TAB: 100 | 30 days supply | Qty: 60 | Fill #0

## 2016-04-12 MED FILL — SERTRALINE HCL 100 MG TAB: 100 | 30 days supply | Qty: 60 | Fill #1

## 2016-05-10 MED FILL — SERTRALINE HCL 100 MG TAB: 100 | 30 days supply | Qty: 60 | Fill #2

## 2016-05-21 DIAGNOSIS — F419 Anxiety disorder, unspecified: Secondary | ICD-10-CM | POA: Diagnosis not present

## 2016-05-24 MED FILL — CIALIS 10 MG TABLET: 10 | 20 days supply | Qty: 4 | Fill #1

## 2016-06-11 MED FILL — SERTRALINE HCL 100 MG TAB: 100 | 30 days supply | Qty: 60 | Fill #0

## 2016-08-09 MED FILL — SERTRALINE HCL 100 MG TAB: 100 | 30 days supply | Qty: 60 | Fill #1

## 2016-10-10 MED FILL — SERTRALINE HCL 100 MG TAB: 100 | 30 days supply | Qty: 60 | Fill #2 | Status: TO

## 2017-06-06 DIAGNOSIS — S61210A Laceration without foreign body of right index finger without damage to nail, initial encounter: Secondary | ICD-10-CM | POA: Diagnosis not present

## 2017-06-21 MED FILL — SERTRALINE HCL 100 MG TAB: 100 | 30 days supply | Qty: 60 | Fill #0 | Status: TO

## 2017-08-22 MED FILL — SERTRALINE HCL 100 MG TAB: 100 | 30 days supply | Qty: 60 | Fill #1 | Status: TO

## 2017-10-04 MED FILL — SERTRALINE HCL 100 MG TAB: 100 | 30 days supply | Qty: 60 | Fill #2 | Status: TO

## 2017-12-06 MED FILL — SERTRALINE HCL 100 MG TAB: 100 | 30 days supply | Qty: 60 | Fill #3 | Status: TO

## 2018-03-04 MED FILL — SERTRALINE HCL 100 MG TAB: 100 | 30 days supply | Qty: 60 | Fill #4 | Status: TO

## 2018-05-28 MED FILL — SERTRALINE HCL 100 MG TAB: 100 | 30 days supply | Qty: 60 | Fill #5 | Status: TO

## 2018-07-18 MED FILL — ESCITALOPRAM 10 MG TABLET: 10 | 30 days supply | Qty: 30 | Fill #0

## 2018-08-22 MED FILL — ESCITALOPRAM 10 MG TABLET: 10 | 30 days supply | Qty: 30 | Fill #1

## 2018-09-24 MED FILL — ESCITALOPRAM 10 MG TABLET: 10 | 30 days supply | Qty: 30 | Fill #2

## 2018-10-28 MED FILL — ESCITALOPRAM 10 MG TABLET: 10 | 30 days supply | Qty: 30 | Fill #0

## 2018-11-29 MED FILL — ESCITALOPRAM 10 MG TABLET: 10 | 30 days supply | Qty: 30 | Fill #1

## 2019-01-06 MED FILL — ESCITALOPRAM 10 MG TABLET: 10 | 30 days supply | Qty: 30 | Fill #2

## 2019-02-03 MED FILL — ESCITALOPRAM 10 MG TABLET: 10 | 90 days supply | Qty: 90 | Fill #0

## 2019-04-30 MED FILL — ESCITALOPRAM 10 MG TABLET: 10 | 30 days supply | Qty: 30 | Fill #0

## 2019-06-02 MED FILL — ESCITALOPRAM 10 MG TABLET: 10 | 30 days supply | Qty: 30 | Fill #1

## 2019-07-04 MED FILL — ESCITALOPRAM 10 MG TABLET: 10 | 30 days supply | Qty: 30 | Fill #2

## 2019-08-04 MED FILL — ESCITALOPRAM 10 MG TABLET: 10 | 30 days supply | Qty: 30 | Fill #0

## 2019-09-03 MED FILL — ESCITALOPRAM 10 MG TABLET: 10 | 30 days supply | Qty: 30 | Fill #1

## 2019-10-01 MED FILL — ESCITALOPRAM 10 MG TABLET: 10 | 30 days supply | Qty: 30 | Fill #2

## 2019-11-12 MED FILL — ESCITALOPRAM 10 MG TABLET: 10 | 30 days supply | Qty: 30 | Fill #0

## 2019-12-27 MED FILL — ESCITALOPRAM 10 MG TABLET: 10 | 30 days supply | Qty: 30 | Fill #1

## 2020-02-04 MED FILL — ESCITALOPRAM 10 MG TABLET: 10 | 30 days supply | Qty: 30 | Fill #2

## 2020-03-10 ENCOUNTER — Other Ambulatory Visit (HOSPITAL_COMMUNITY): Payer: Self-pay | Admitting: Psychiatry

## 2020-03-14 MED FILL — ESCITALOPRAM 10 MG TABLET: 10 | 30 days supply | Qty: 30 | Fill #0

## 2020-06-07 ENCOUNTER — Other Ambulatory Visit (HOSPITAL_BASED_OUTPATIENT_CLINIC_OR_DEPARTMENT_OTHER): Payer: Self-pay

## 2020-06-22 ENCOUNTER — Other Ambulatory Visit (HOSPITAL_COMMUNITY): Payer: Self-pay

## 2020-06-22 MED FILL — Escitalopram Oxalate Tab 10 MG (Base Equiv): ORAL | 30 days supply | Qty: 30 | Fill #0 | Status: AC

## 2020-06-23 ENCOUNTER — Other Ambulatory Visit (HOSPITAL_COMMUNITY): Payer: Self-pay

## 2020-08-16 MED FILL — Escitalopram Oxalate Tab 10 MG (Base Equiv): ORAL | 30 days supply | Qty: 30 | Fill #1 | Status: AC

## 2020-08-17 ENCOUNTER — Other Ambulatory Visit (HOSPITAL_COMMUNITY): Payer: Self-pay

## 2020-08-18 ENCOUNTER — Other Ambulatory Visit (HOSPITAL_COMMUNITY): Payer: Self-pay

## 2020-10-17 ENCOUNTER — Other Ambulatory Visit: Payer: Self-pay

## 2020-10-17 ENCOUNTER — Other Ambulatory Visit (HOSPITAL_COMMUNITY): Payer: Self-pay

## 2020-10-19 ENCOUNTER — Other Ambulatory Visit (HOSPITAL_COMMUNITY): Payer: Self-pay

## 2020-10-19 ENCOUNTER — Other Ambulatory Visit: Payer: Self-pay

## 2020-10-20 ENCOUNTER — Other Ambulatory Visit (HOSPITAL_COMMUNITY): Payer: Self-pay

## 2020-10-21 ENCOUNTER — Other Ambulatory Visit (HOSPITAL_COMMUNITY): Payer: Self-pay

## 2020-10-24 ENCOUNTER — Other Ambulatory Visit (HOSPITAL_COMMUNITY): Payer: Self-pay

## 2020-10-24 ENCOUNTER — Other Ambulatory Visit: Payer: Self-pay

## 2020-10-25 ENCOUNTER — Other Ambulatory Visit (HOSPITAL_COMMUNITY): Payer: Self-pay

## 2020-10-25 MED ORDER — ESCITALOPRAM OXALATE 10 MG PO TABS
10.0000 mg | ORAL_TABLET | Freq: Every day | ORAL | 2 refills | Status: AC
  Filled 2020-10-25: qty 30, 30d supply, fill #0
  Filled 2020-12-19: qty 30, 30d supply, fill #1
  Filled 2021-02-20: qty 30, 30d supply, fill #2

## 2020-10-26 ENCOUNTER — Other Ambulatory Visit (HOSPITAL_COMMUNITY): Payer: Self-pay

## 2020-12-19 ENCOUNTER — Other Ambulatory Visit (HOSPITAL_COMMUNITY): Payer: Self-pay

## 2020-12-22 ENCOUNTER — Other Ambulatory Visit (HOSPITAL_COMMUNITY): Payer: Self-pay

## 2021-02-01 ENCOUNTER — Other Ambulatory Visit (HOSPITAL_COMMUNITY): Payer: Self-pay

## 2021-02-21 ENCOUNTER — Other Ambulatory Visit (HOSPITAL_COMMUNITY): Payer: Self-pay

## 2021-03-29 ENCOUNTER — Other Ambulatory Visit (HOSPITAL_COMMUNITY): Payer: Self-pay

## 2021-03-29 MED ORDER — ESCITALOPRAM OXALATE 5 MG PO TABS
5.0000 mg | ORAL_TABLET | Freq: Every day | ORAL | 3 refills | Status: AC
  Filled 2021-03-29: qty 90, 90d supply, fill #0
  Filled 2021-07-18: qty 90, 90d supply, fill #1
  Filled 2021-10-16: qty 90, 90d supply, fill #2
  Filled 2021-10-17: qty 90, 90d supply, fill #0

## 2021-03-31 ENCOUNTER — Other Ambulatory Visit (HOSPITAL_COMMUNITY): Payer: Self-pay

## 2021-04-05 ENCOUNTER — Other Ambulatory Visit (HOSPITAL_COMMUNITY): Payer: Self-pay

## 2021-07-18 ENCOUNTER — Other Ambulatory Visit (HOSPITAL_COMMUNITY): Payer: Self-pay

## 2021-07-19 ENCOUNTER — Other Ambulatory Visit (HOSPITAL_COMMUNITY): Payer: Self-pay

## 2021-10-16 ENCOUNTER — Other Ambulatory Visit (HOSPITAL_COMMUNITY): Payer: Self-pay

## 2021-10-17 ENCOUNTER — Other Ambulatory Visit (HOSPITAL_BASED_OUTPATIENT_CLINIC_OR_DEPARTMENT_OTHER): Payer: Self-pay

## 2021-10-18 ENCOUNTER — Other Ambulatory Visit (HOSPITAL_COMMUNITY): Payer: Self-pay

## 2021-10-19 ENCOUNTER — Other Ambulatory Visit (HOSPITAL_COMMUNITY): Payer: Self-pay

## 2022-01-17 ENCOUNTER — Other Ambulatory Visit (HOSPITAL_BASED_OUTPATIENT_CLINIC_OR_DEPARTMENT_OTHER): Payer: Self-pay

## 2022-01-19 ENCOUNTER — Other Ambulatory Visit (HOSPITAL_BASED_OUTPATIENT_CLINIC_OR_DEPARTMENT_OTHER): Payer: Self-pay

## 2022-04-17 ENCOUNTER — Other Ambulatory Visit (HOSPITAL_COMMUNITY): Payer: Self-pay

## 2022-04-20 ENCOUNTER — Other Ambulatory Visit (HOSPITAL_COMMUNITY): Payer: Self-pay

## 2022-04-20 MED ORDER — ESCITALOPRAM OXALATE 5 MG PO TABS
5.0000 mg | ORAL_TABLET | Freq: Every day | ORAL | 0 refills | Status: AC
  Filled 2022-04-20 – 2022-04-24 (×2): qty 90, 90d supply, fill #0

## 2022-04-24 ENCOUNTER — Other Ambulatory Visit: Payer: Self-pay

## 2022-04-24 ENCOUNTER — Other Ambulatory Visit (HOSPITAL_COMMUNITY): Payer: Self-pay

## 2022-05-11 ENCOUNTER — Other Ambulatory Visit: Payer: Self-pay
